# Patient Record
Sex: Male | Born: 1990
Health system: Southern US, Community
[De-identification: ages and names within clinical notes are randomized; demographics above are authoritative.]

## PROBLEM LIST (undated history)

## (undated) DIAGNOSIS — Z789 Other specified health status: Secondary | ICD-10-CM

## (undated) HISTORY — DX: Other specified health status: Z78.9

## (undated) HISTORY — PX: TYMPANOPLASTY: SHX33

---
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
## 2010-01-13 IMAGING — CT CT MAXILLOFACIAL WITHOUT CONTRAST
1 series · 16 of 30 positions shown, 20 images · non-contrast
Comparison: none

REASON FOR EXAM: facial trauma, R facial swelling
COMMENTS:

[Series 2: facial 3.0 h60f · axial · 0.34mm/px · z∈[-194,-36]mm · 16 of 57 slices shown, 20 images]
[im 2/57  brain]
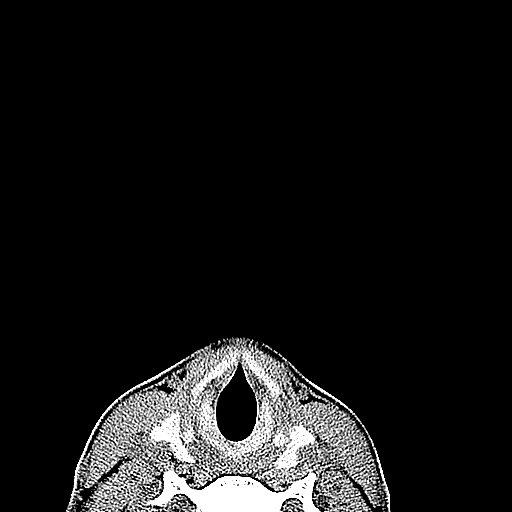
[im 2/57  bone]
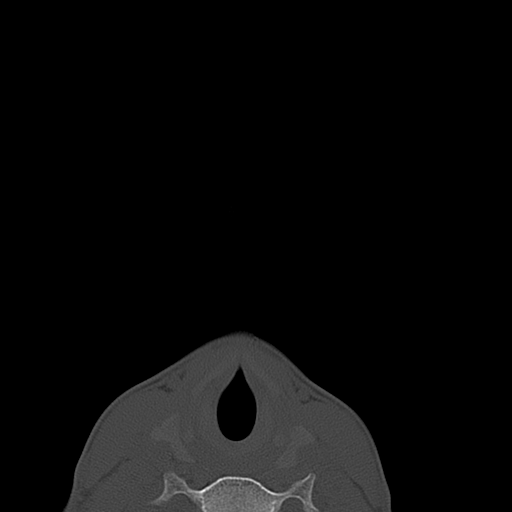
[im 6/57  bone]
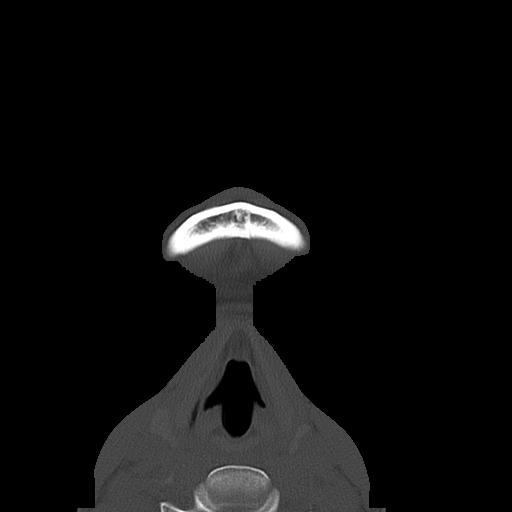
[im 10/57  bone]
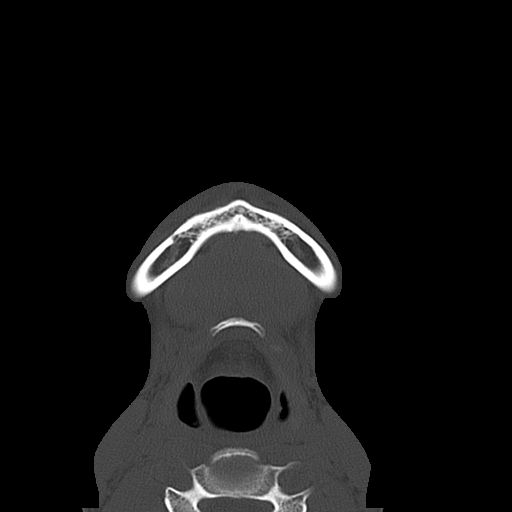
[im 14/57  bone]
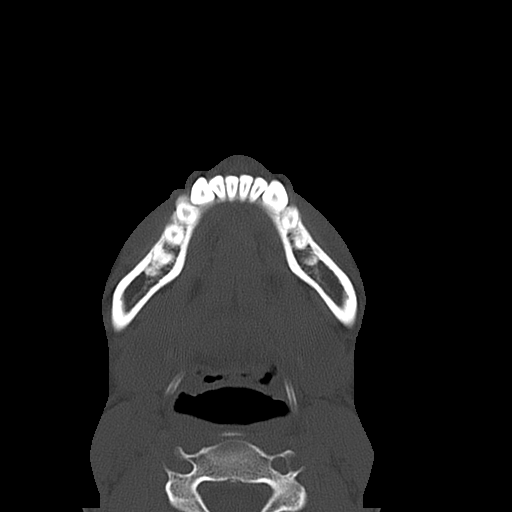
[im 16/57  brain]
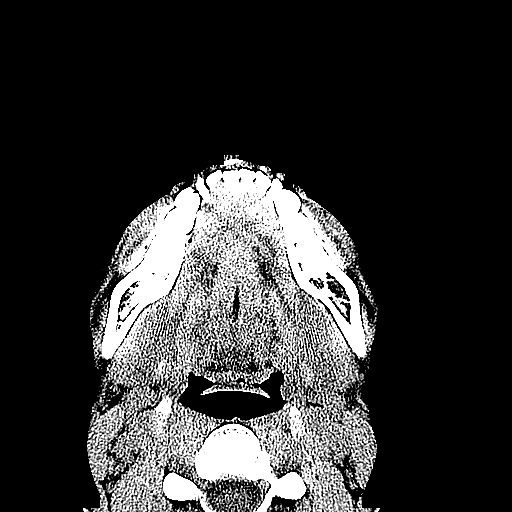
[im 16/57  bone]
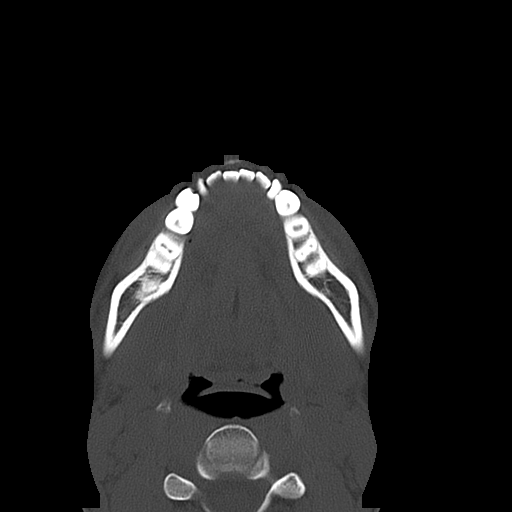
[im 20/57  bone]
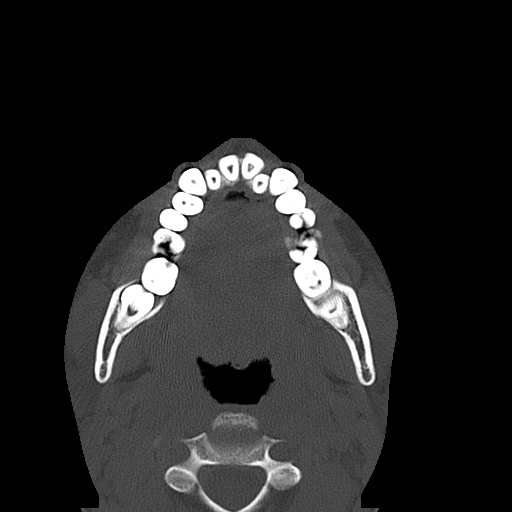
[im 24/57  bone]
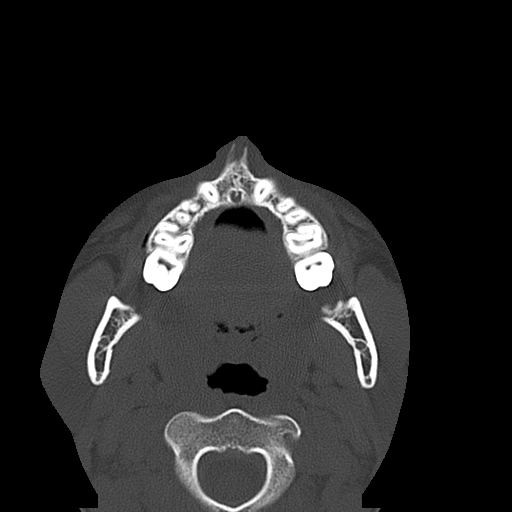
[im 28/57  bone]
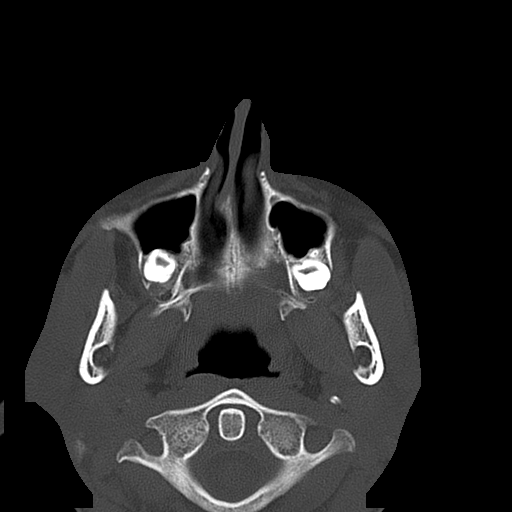
[im 29/57  brain]
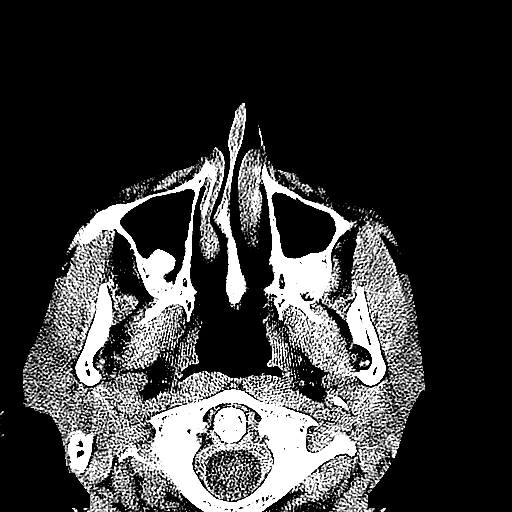
[im 29/57  bone]
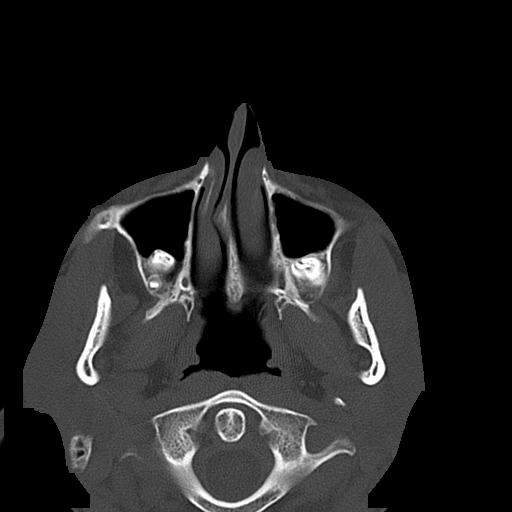
[im 33/57  bone]
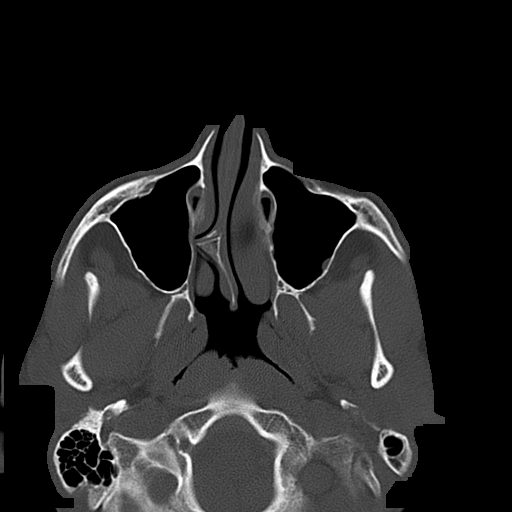
[im 37/57  bone]
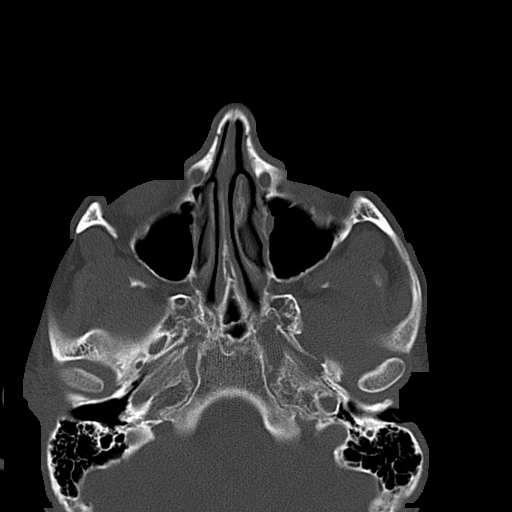
[im 41/57  bone]
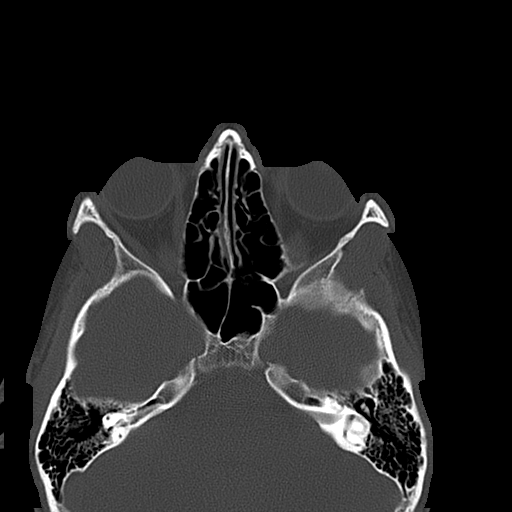
[im 43/57  brain]
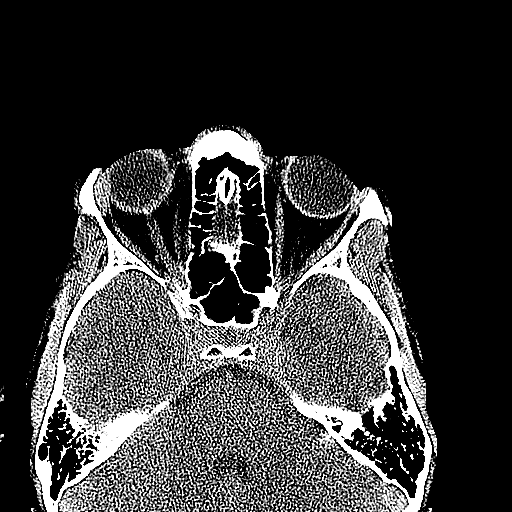
[im 43/57  bone]
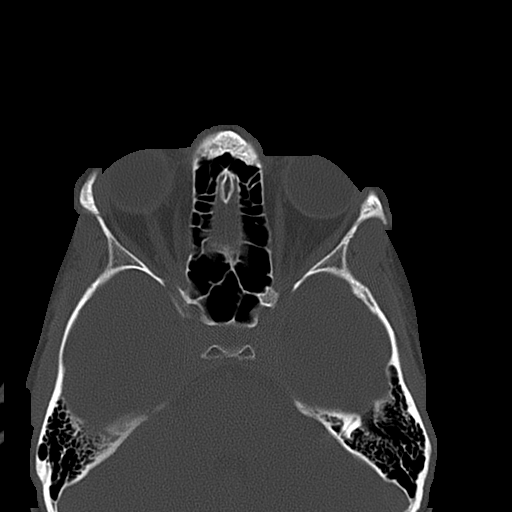
[im 47/57  bone]
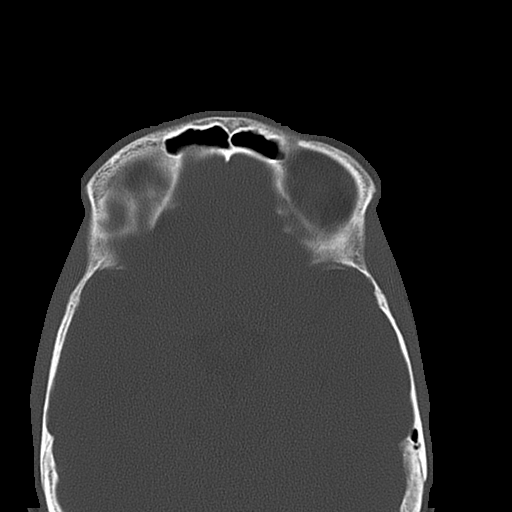
[im 51/57  bone]
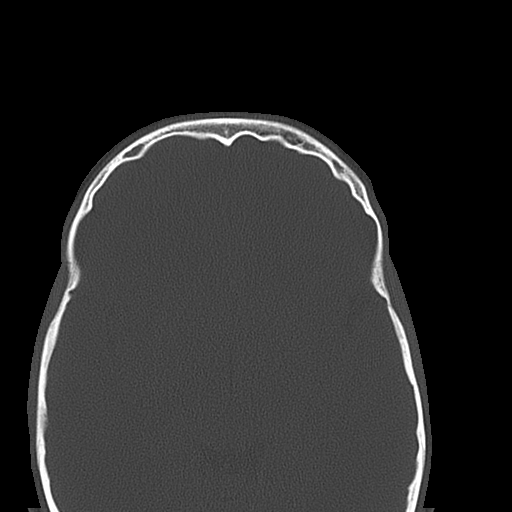
[im 55/57  bone]
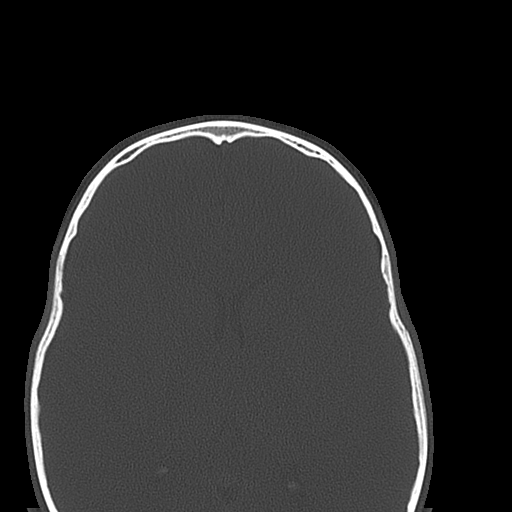

[16 of 30 positions shown; findings below may reference images not displayed]

PROCEDURE:     CT  - CT MAXILLOFACIAL AREA WO  - October 31, 2007  [DATE]

RESULT:     The patient sustained trauma to the face. Axial and coronal
images are reviewed. There is deformity of the nasal bone on the LEFT but I
do not see significant overlying swelling. I suspect that this is old. No
air-fluid levels are seen in the paranasal sinuses. The zygomatic arches are
intact. The pterygoid plates are intact. The temporomandibular joints are
normal in appearance. No mandibular fracture is identified. The nasal septum
is deviated somewhat toward the RIGHT but this appears to be developmental.
The nasal spine is intact. The bony orbits appear intact.
IMPRESSION: 1. I do not see evidence of an acute facial bone fracture.
2. There are no air-fluid levels in the paranasal sinuses.

This report was called to the [HOSPITAL] the conclusion of the
study.

## 2019-01-03 ENCOUNTER — Other Ambulatory Visit: Payer: Self-pay

## 2019-01-03 DIAGNOSIS — Z20822 Contact with and (suspected) exposure to covid-19: Secondary | ICD-10-CM

## 2019-01-04 LAB — NOVEL CORONAVIRUS, NAA: SARS-CoV-2, NAA: DETECTED — AB

## 2019-01-06 ENCOUNTER — Telehealth: Payer: Self-pay | Admitting: Critical Care Medicine

## 2019-01-06 NOTE — Telephone Encounter (Signed)
I connected with this patient who is Covid positive from a November 15 testing event.  The patient has loss of taste and smell has had body aches headaches low-grade fever.  The patient's rapidly improving.  The patient's remain remaining in isolation.  He knows the health department may be in touch with them.  I gave patient guide is to stay in isolation for 10 days which would take him until November 26.

## 2020-04-13 DIAGNOSIS — R519 Headache, unspecified: Secondary | ICD-10-CM | POA: Diagnosis not present

## 2020-04-13 DIAGNOSIS — Z20822 Contact with and (suspected) exposure to covid-19: Secondary | ICD-10-CM | POA: Diagnosis not present

## 2020-04-13 DIAGNOSIS — Z03818 Encounter for observation for suspected exposure to other biological agents ruled out: Secondary | ICD-10-CM | POA: Diagnosis not present

## 2020-04-13 DIAGNOSIS — R21 Rash and other nonspecific skin eruption: Secondary | ICD-10-CM | POA: Diagnosis not present

## 2020-05-08 DIAGNOSIS — Z23 Encounter for immunization: Secondary | ICD-10-CM | POA: Diagnosis not present

## 2020-05-08 DIAGNOSIS — R69 Illness, unspecified: Secondary | ICD-10-CM | POA: Diagnosis not present

## 2020-05-08 DIAGNOSIS — Z114 Encounter for screening for human immunodeficiency virus [HIV]: Secondary | ICD-10-CM | POA: Diagnosis not present

## 2020-05-08 DIAGNOSIS — Z0000 Encounter for general adult medical examination without abnormal findings: Secondary | ICD-10-CM | POA: Diagnosis not present

## 2020-11-14 DIAGNOSIS — Z03818 Encounter for observation for suspected exposure to other biological agents ruled out: Secondary | ICD-10-CM | POA: Diagnosis not present

## 2020-11-14 DIAGNOSIS — B9689 Other specified bacterial agents as the cause of diseases classified elsewhere: Secondary | ICD-10-CM | POA: Diagnosis not present

## 2020-11-14 DIAGNOSIS — J029 Acute pharyngitis, unspecified: Secondary | ICD-10-CM | POA: Diagnosis not present

## 2020-11-14 DIAGNOSIS — J329 Chronic sinusitis, unspecified: Secondary | ICD-10-CM | POA: Diagnosis not present

## 2021-07-27 DIAGNOSIS — E7800 Pure hypercholesterolemia, unspecified: Secondary | ICD-10-CM | POA: Diagnosis not present

## 2021-07-27 DIAGNOSIS — Z0000 Encounter for general adult medical examination without abnormal findings: Secondary | ICD-10-CM | POA: Diagnosis not present

## 2021-07-27 DIAGNOSIS — N50812 Left testicular pain: Secondary | ICD-10-CM | POA: Diagnosis not present

## 2021-08-01 ENCOUNTER — Other Ambulatory Visit: Payer: Self-pay | Admitting: Infectious Diseases

## 2021-08-01 DIAGNOSIS — N50812 Left testicular pain: Secondary | ICD-10-CM

## 2021-08-02 ENCOUNTER — Other Ambulatory Visit: Payer: Self-pay | Admitting: Infectious Diseases

## 2021-08-02 DIAGNOSIS — E7800 Pure hypercholesterolemia, unspecified: Secondary | ICD-10-CM

## 2021-08-02 DIAGNOSIS — N50812 Left testicular pain: Secondary | ICD-10-CM

## 2021-08-08 ENCOUNTER — Ambulatory Visit
Admission: RE | Admit: 2021-08-08 | Discharge: 2021-08-08 | Disposition: A | Discharge status: Home / Self Care | Payer: 59 | Source: Ambulatory Visit | Attending: Infectious Diseases | Admitting: Infectious Diseases

## 2021-08-08 DIAGNOSIS — N50812 Left testicular pain: Secondary | ICD-10-CM | POA: Diagnosis not present

## 2021-08-08 DIAGNOSIS — E7800 Pure hypercholesterolemia, unspecified: Secondary | ICD-10-CM | POA: Insufficient documentation

## 2021-08-08 DIAGNOSIS — N503 Cyst of epididymis: Secondary | ICD-10-CM | POA: Diagnosis not present

## 2021-12-24 DIAGNOSIS — J029 Acute pharyngitis, unspecified: Secondary | ICD-10-CM | POA: Diagnosis not present

## 2021-12-24 DIAGNOSIS — Z03818 Encounter for observation for suspected exposure to other biological agents ruled out: Secondary | ICD-10-CM | POA: Diagnosis not present

## 2021-12-24 DIAGNOSIS — R0989 Other specified symptoms and signs involving the circulatory and respiratory systems: Secondary | ICD-10-CM | POA: Diagnosis not present

## 2022-10-14 DIAGNOSIS — Z0000 Encounter for general adult medical examination without abnormal findings: Secondary | ICD-10-CM | POA: Diagnosis not present

## 2023-03-10 DIAGNOSIS — E860 Dehydration: Secondary | ICD-10-CM | POA: Diagnosis not present

## 2023-03-10 DIAGNOSIS — R112 Nausea with vomiting, unspecified: Secondary | ICD-10-CM | POA: Diagnosis not present

## 2023-03-10 DIAGNOSIS — Z03818 Encounter for observation for suspected exposure to other biological agents ruled out: Secondary | ICD-10-CM | POA: Diagnosis not present

## 2023-04-15 ENCOUNTER — Ambulatory Visit: Payer: 59 | Admitting: Urology

## 2023-04-15 ENCOUNTER — Encounter: Payer: Self-pay | Admitting: Urology

## 2023-04-15 VITALS — BP 128/81 | HR 76 | Ht 65.0 in | Wt 170.0 lb

## 2023-04-15 DIAGNOSIS — Z3009 Encounter for other general counseling and advice on contraception: Secondary | ICD-10-CM | POA: Diagnosis not present

## 2023-04-15 NOTE — Progress Notes (Signed)
 @[facility] @ 4:14 PM   Aaron Yang February 23, 1990 [id number]  Referring provider: Mick Sell, MD [address] [place],  [place] [address]  Chief Complaint  Patient presents with   Establish Care   VAS Consult    HPI: 33 y.o. male referred for further evaluation of possible vasectomy.  He denies a history of testicular trauma or pain.  No urinary issues.  No previous scrotal surgeries.  He has 2 children and doesn't want any additional children.    PMH: Past Medical History:  Diagnosis Date   No pertinent past medical history     Surgical History: *** The histories are not reviewed yet. Please review them in the "History" navigator section and refresh this SmartLink.  Home Medications:  Allergies as of 04/15/2023   No Known Allergies      Medication List    as of April 15, 2023  4:14 PM   You have not been prescribed any medications.     Social History:  reports that he has never smoked. He has never used smokeless tobacco. He reports current alcohol use. He reports that he does not use drugs.   Physical Exam: BP 128/81   Pulse 76   Ht 5\' 5"  (1.651 m)   Wt 170 lb (77.1 kg)   BMI 28.29 kg/m   Constitutional:  Alert and oriented, No acute distress. HEENT: [place] AT, moist mucus membranes.  Trachea midline, no masses. Cardiovascular: No clubbing, cyanosis, or edema. Respiratory: Normal respiratory effort, no increased work of breathing. GI: Abdomen is soft, nontender, nondistended, no abdominal masses GU: Normal phallus.  Bilateral descended testicles without masses.  Vasa easily palpable bilaterally. Skin: No rashes, bruises or suspicious lesions. Neurologic: Grossly intact, no focal deficits, moving all 4 extremities. Psychiatric: Normal mood and affect.   Assessment & Plan:    1. Vasectomy evaluation Today, we discussed what the vas deferens is, where it is located, and its function. We reviewed the procedure for vasectomy, it's  risks, benefits, alternatives, and likelihood of achieving his goals. We discussed in detail the procedure, complications, and recovery as well as the need for clearance prior to unprotected intercourse. We discussed that vasectomy does not protect against sexually transmitted diseases. We discussed that this procedure does not result in immediate sterility and that they would need to use other forms of birth control until he has been cleared with negative postvasectomy semen analyses. I explained that the procedure is considered to be permanent and that attempts at reversal have varying degrees of success. These options include vasectomy reversal, sperm retrieval, and in vitro fertilization; these can be very expensive. We discussed the chance of postvasectomy pain syndrome which occurs in less than 5% of patients. I explained to the patient that there is no treatment to resolve this chronic pain, and that if it developed I would not be able to help resolve the issue, but that surgery is generally not needed for correction. I explained there have even been reports of systemic like illness associated with this chronic pain, and that there was no good cure. I explained that vasectomy it is not a 100% reliable form of birth control, and the risk of pregnancy after vasectomy is approximately 1 in 2000 men who had a negative postvasectomy semen analysis or rare non-motile sperm. I explained that repeat vasectomy was necessary in less than 1% of vasectomy procedures when employing the type of technique that I use. I explained that he should refrain from ejaculation for approximately  one week following vasectomy. I explained that there are other options for birth control which are permanent and non-permanent; we discussed these. I explained the rates of surgical complications, such as symptomatic hematoma or infection, are low (1-2%) and vary with the surgeon's experience and criteria used to diagnose the complication.    He had the opportunity to ask questions to his stated satisfaction. He voiced understanding of the above factors and stated that he has read all the information provided to him and the packets and informed consent.  He is interested in receiving of Valium 10 mg prior to the procedure for the purpose of anxiolysis.  A prescription was given today.  He will have a driver on the day of the procedure.  No follow-ups on file.  [facility] [address], [address] [place], [place] [address] [phone]

## 2023-04-15 NOTE — Patient Instructions (Signed)
 Pre-Vasectomy Instructions ? ?STOP all aspirin or blood thinners (Aspirin, Plavix, Coumadin, Warfarin, Motrin, Ibuprofen, Advil, Aleve, Naproxen, Naprosyn) for 7 days prior to the procedure.  If you have any questions about stopping these medications please contact your primary care physician or cardiologist. ? ?Shave all hair from the upper scrotum on the day of the procedure.  This means just under the penis onto the scrotal sac.  The area shaved should measure about 2-3 inches around.  You may lather the scrotum with soap and water, and shave with a safety razor. ? ?After shaving the area, thoroughly wash the penis and the scrotum, then shower or bathe to remove all the loose hairs.  If needed, wash the area again just before coming in for your Vasectomy. ? ?It is recommended to have a light meal an hour or so prior to the procedure. ? ?Bring a scrotal support (jock strap or suspensory, or tight jockey shorts or underwear).  Wear comfortable pants or shorts. ? ?While the actual procedure usually takes about 45 minutes, you should be prepared to stay in the office for approximately one hour.  Bring someone with you to drive you home. ? ?If you have any questions or concerns, please feel free to call the office at [phone]. ?  ?

## 2023-04-16 MED ORDER — DIAZEPAM 10 MG PO TABS: 10.0000 mg | ORAL_TABLET | Freq: Once | ORAL | 0 refills | Status: AC

## 2023-05-30 ENCOUNTER — Encounter: Payer: 59 | Admitting: Urology

## 2023-10-22 IMAGING — US US SCROTUM W/ DOPPLER COMPLETE
1 series · 14 of 25 positions shown · non-contrast
Comparison: None Available.

CLINICAL DATA: Left testicular pain and swelling, initial encounter

EXAM:
SCROTAL ULTRASOUND
DOPPLER ULTRASOUND OF THE TESTICLES
TECHNIQUE: Complete ultrasound examination of the testicles, epididymis, and
other scrotal structures was performed. Color and spectral Doppler
ultrasound were also utilized to evaluate blood flow to the
testicles.

[Series 1: us scrotum w/ doppler complete · 0.06mm/px · 14 of 78 slices shown]
[im 1/78]
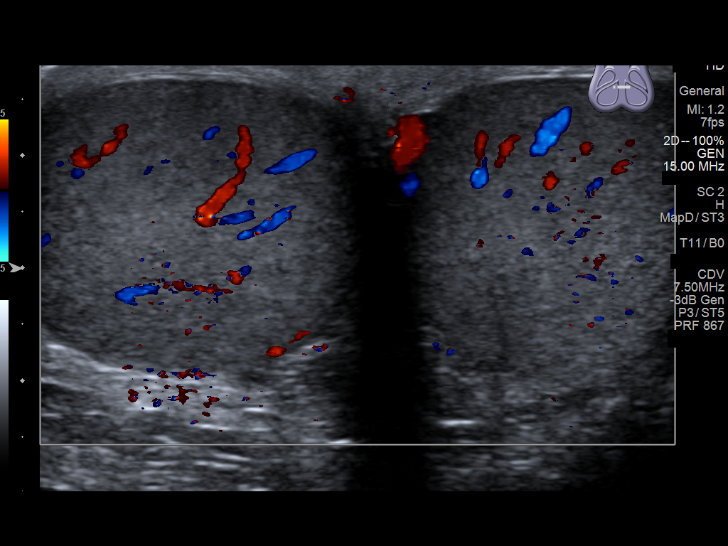
[im 7/78]
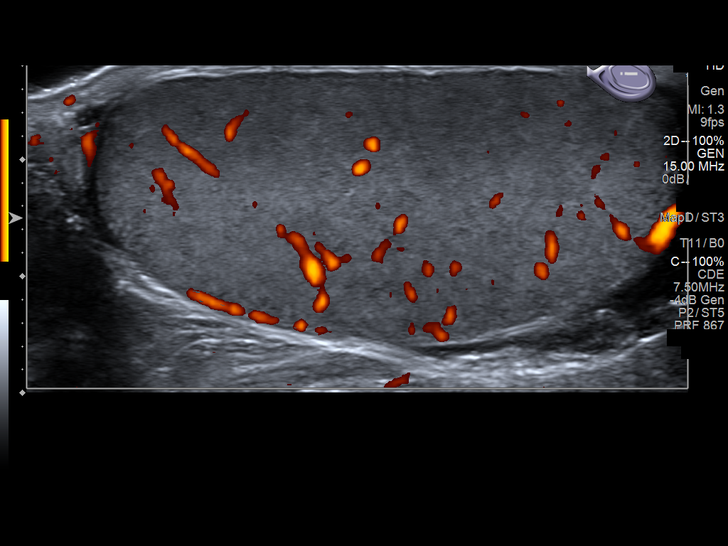
[im 13/78]
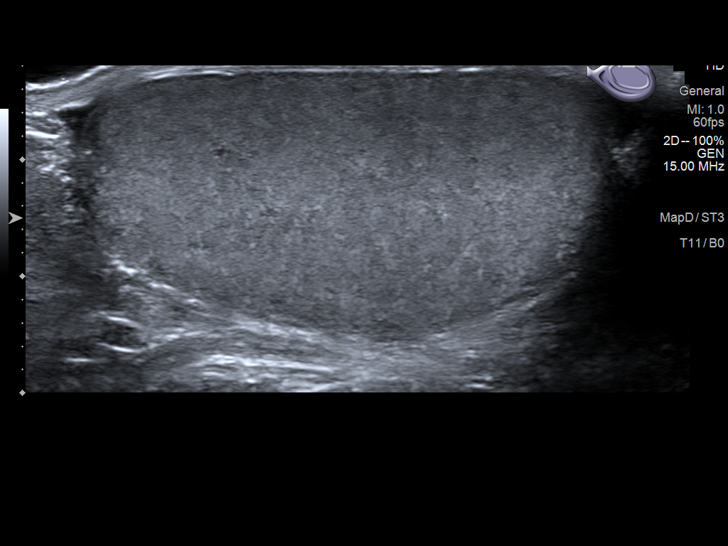
[im 20/78]
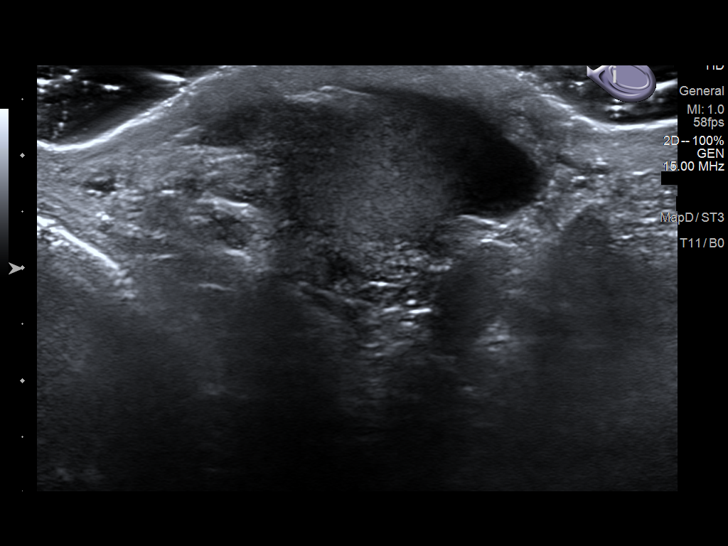
[im 26/78]
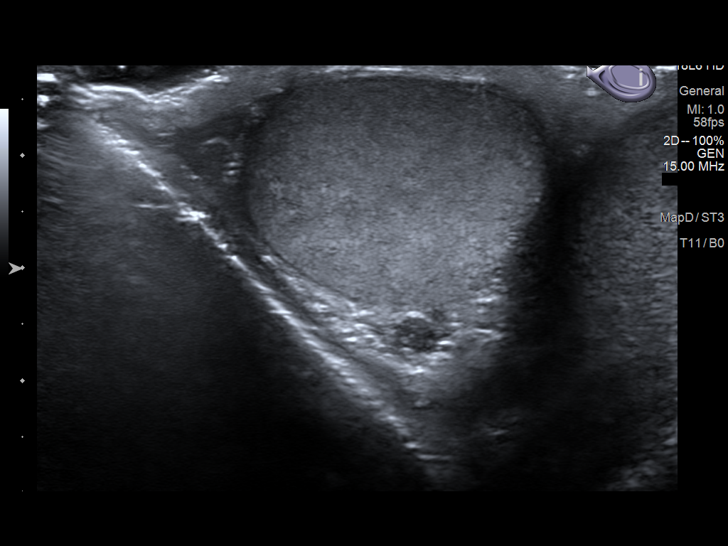
[im 29/78]
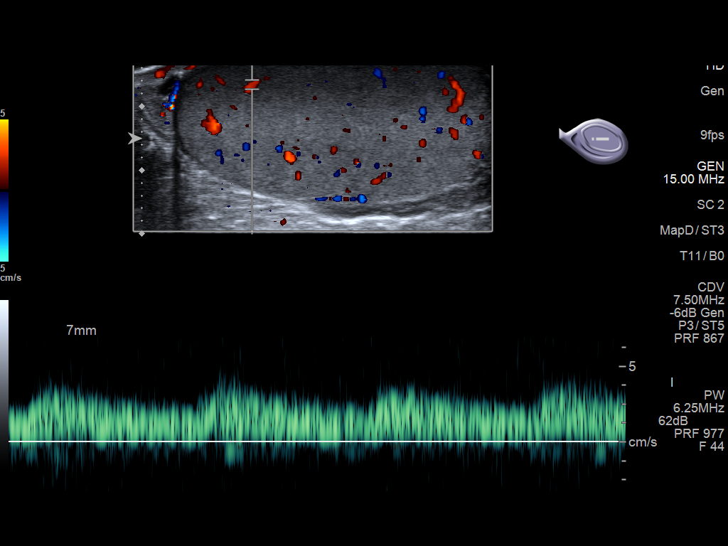
[im 36/78]
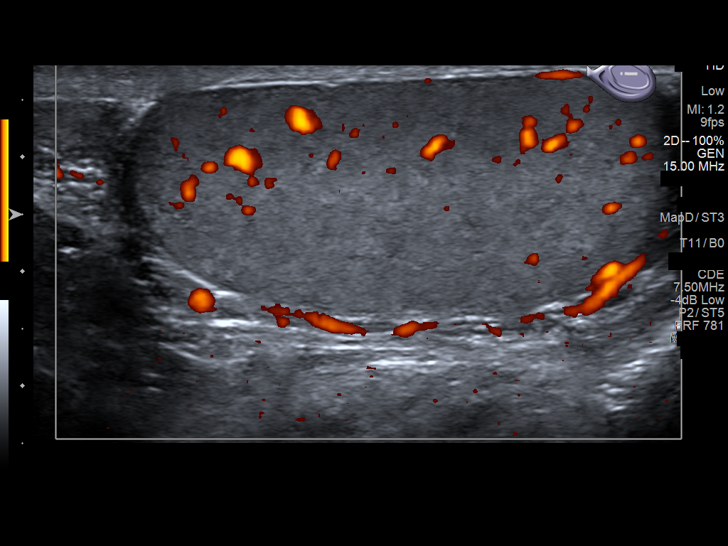
[im 42/78]
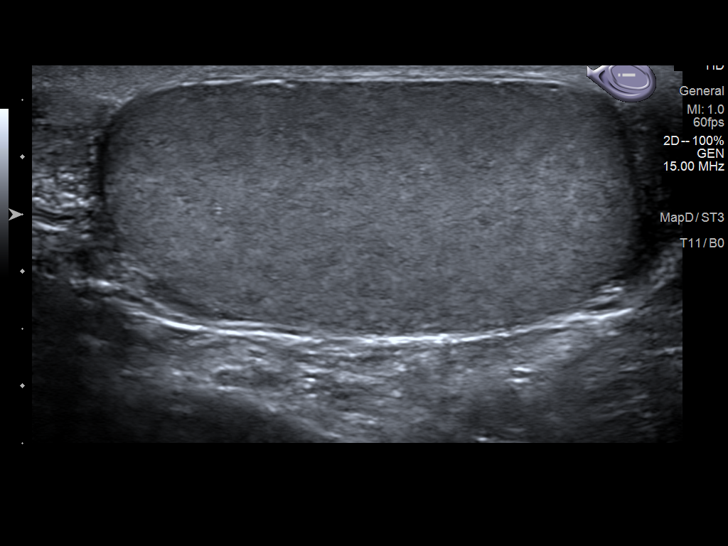
[im 49/78]
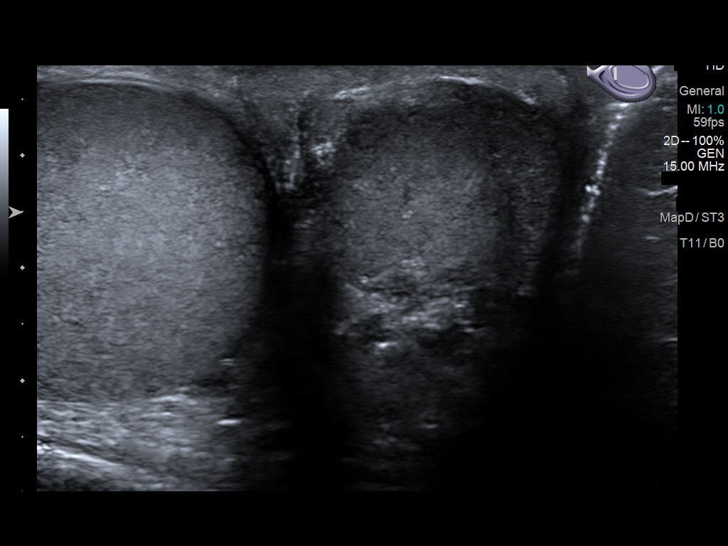
[im 52/78]
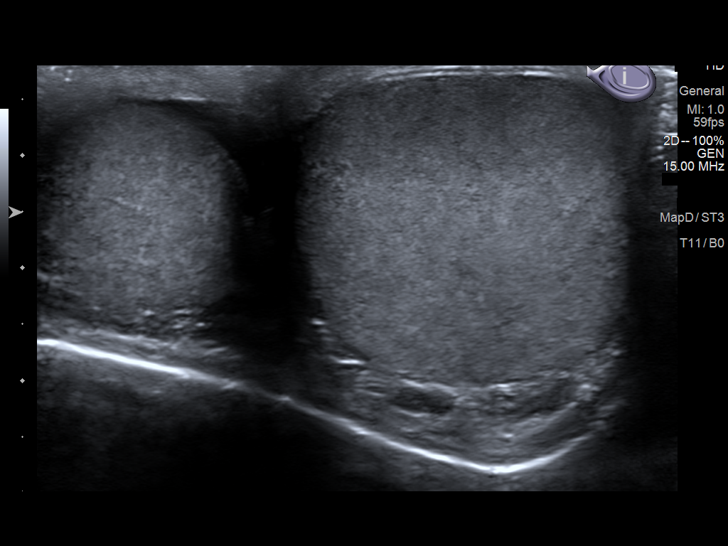
[im 58/78]
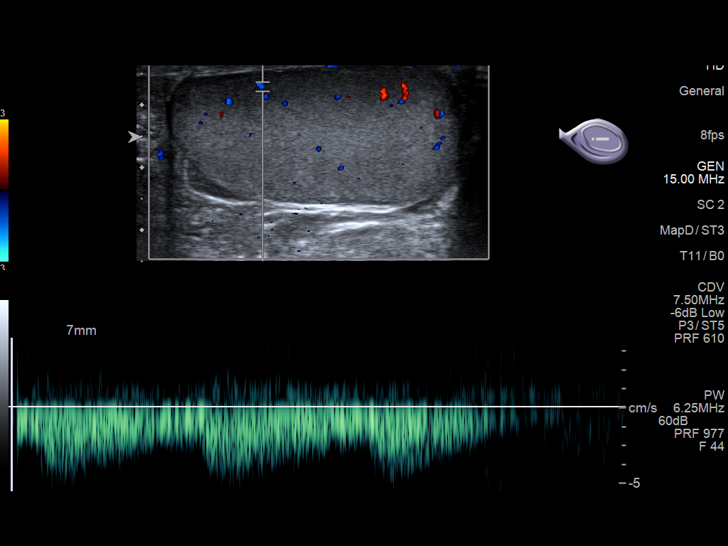
[im 65/78]
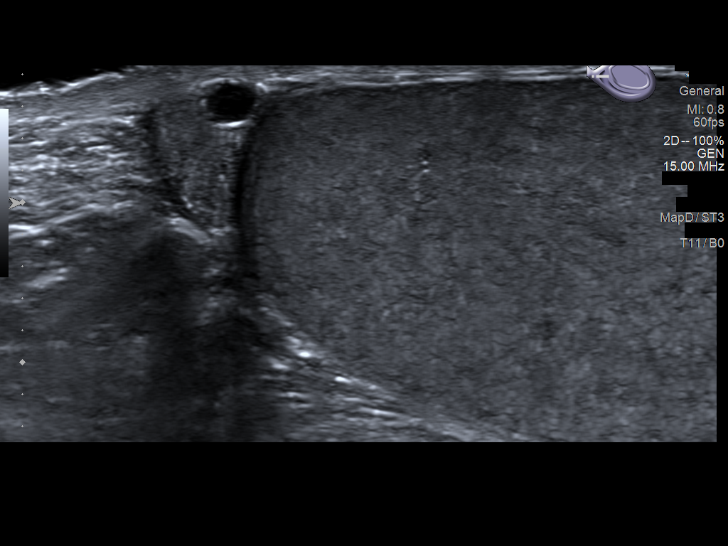
[im 71/78]
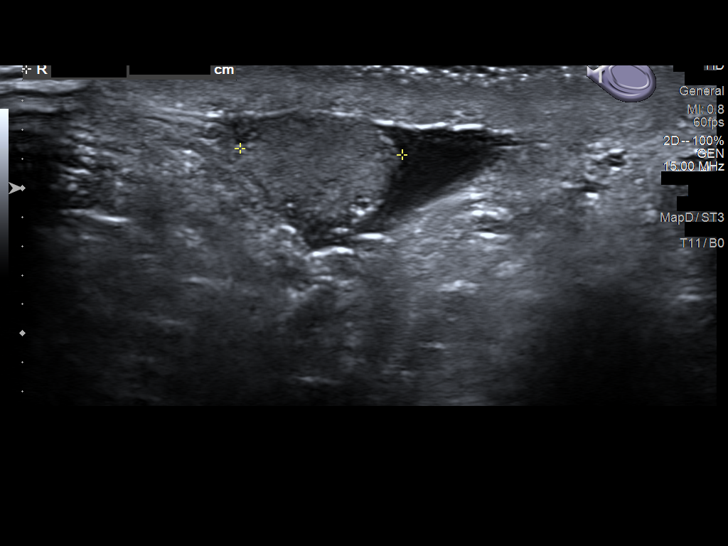
[im 78/78]
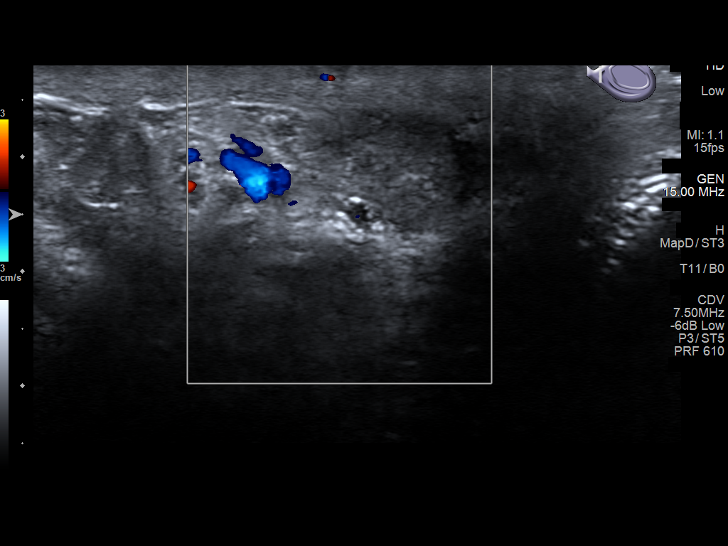

[14 of 25 positions shown; findings below may reference images not displayed]

FINDINGS: Right testicle

Measurements: 5.3 x 2.2 x 3.3 cm. No mass or microlithiasis
visualized.

Left testicle

Measurements: 4.7 x 2.0 x 3.0 cm. No mass or microlithiasis
visualized.

Right epididymis:  3 mm cyst is noted within the right epididymis.

Left epididymis:  Normal in size and appearance.

Hydrocele:  None visualized.

Varicocele:  None visualized.

Pulsed Doppler interrogation of both testes demonstrates normal low
resistance arterial and venous waveforms bilaterally.
IMPRESSION: 3 mm right epididymal cyst.

No other focal abnormality is noted.
